# Patient Record
Sex: Male | Born: 2005 | Race: White | Hispanic: No | Marital: Single | Smoking: Never smoker
Health system: Southern US, Community
[De-identification: ages and names within clinical notes are randomized; demographics above are authoritative.]

## PROBLEM LIST (undated history)

## (undated) DIAGNOSIS — R51 Headache: Secondary | ICD-10-CM

## (undated) DIAGNOSIS — R519 Headache, unspecified: Secondary | ICD-10-CM

## (undated) HISTORY — PX: CIRCUMCISION: SUR203

## (undated) HISTORY — DX: Headache: R51

## (undated) HISTORY — DX: Headache, unspecified: R51.9

---
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
## 2023-04-22 DIAGNOSIS — J029 Acute pharyngitis, unspecified: Secondary | ICD-10-CM | POA: Diagnosis not present

## 2023-05-14 DIAGNOSIS — J309 Allergic rhinitis, unspecified: Secondary | ICD-10-CM | POA: Diagnosis not present

## 2023-05-14 DIAGNOSIS — J329 Chronic sinusitis, unspecified: Secondary | ICD-10-CM | POA: Diagnosis not present

## 2023-05-14 DIAGNOSIS — R0981 Nasal congestion: Secondary | ICD-10-CM | POA: Diagnosis not present

## 2023-07-23 ENCOUNTER — Emergency Department
Admission: EM | Admit: 2023-07-23 | Discharge: 2023-07-23 | Disposition: A | Discharge status: Home / Self Care | Payer: 59 | Attending: Emergency Medicine | Admitting: Emergency Medicine

## 2023-07-23 ENCOUNTER — Emergency Department: Payer: 59

## 2023-07-23 ENCOUNTER — Encounter: Payer: Self-pay | Admitting: Emergency Medicine

## 2023-07-23 DIAGNOSIS — N50812 Left testicular pain: Secondary | ICD-10-CM | POA: Insufficient documentation

## 2023-07-23 DIAGNOSIS — N50811 Right testicular pain: Secondary | ICD-10-CM | POA: Diagnosis not present

## 2023-07-23 DIAGNOSIS — N50819 Testicular pain, unspecified: Secondary | ICD-10-CM | POA: Diagnosis not present

## 2023-07-23 LAB — URINALYSIS, ROUTINE W REFLEX MICROSCOPIC
Bilirubin Urine: NEGATIVE
Glucose, UA: NEGATIVE mg/dL
Hgb urine dipstick: NEGATIVE
Ketones, ur: NEGATIVE mg/dL
Leukocytes,Ua: NEGATIVE
Nitrite: NEGATIVE
Protein, ur: NEGATIVE mg/dL
Specific Gravity, Urine: 1.004 — ABNORMAL LOW (ref 1.005–1.030)
pH: 6 (ref 5.0–8.0)

## 2023-07-23 LAB — CHLAMYDIA/NGC RT PCR (ARMC ONLY)
Chlamydia Tr: NOT DETECTED
N gonorrhoeae: NOT DETECTED

## 2023-07-23 NOTE — ED Notes (Signed)
ED Provider at bedside. 

## 2023-07-23 NOTE — ED Provider Notes (Signed)
[facility] Provider Note    Event Date/Time   First MD Initiated Contact with Patient 07/23/23 [phone]     (approximate)   History   Chief Complaint Testicle Pain   HPI  David Boyle is a 17 y.o. male with no significant past medical history who presents to the ED complaining of testicular pain.  Patient reports that he has been dealing with waxing and waning pain in both of his testicles for about the past week.  He states that both testicles seem to "shriveled up" into his groin with significant pain at times, but this is not brought on by anything in particular.  He denies any trauma to his testicles and has not noticed any swelling.  He denies any skin lesions and has not had any dysuria or penile discharge.  He states he has not been sexually active.  Pain got acutely worse this evening and so he came to the ED for further evaluation.     Physical Exam   Triage Vital Signs: ED Triage Vitals  Encounter Vitals Group     BP      Systolic BP Percentile      Diastolic BP Percentile      Pulse      Resp      Temp      Temp src      SpO2      Weight      Height      Head Circumference      Peak Flow      Pain Score      Pain Loc      Pain Education      Exclude from Growth Chart     Most recent vital signs: Vitals:   07/23/23 0527  BP: 139/85  Pulse: 98  Resp: 18  Temp: 98 F (36.7 C)  SpO2: 100%    Constitutional: Alert and oriented. Eyes: Conjunctivae are normal. Head: Atraumatic. Nose: No congestion/rhinnorhea. Mouth/Throat: Mucous membranes are moist.  Cardiovascular: Normal rate, regular rhythm. Grossly normal heart sounds.  2+ radial pulses bilaterally. Respiratory: Normal respiratory effort.  No retractions. Lungs CTAB. Gastrointestinal: Soft and nontender. No distention. Genitourinary: Bilateral testicular tenderness with no erythema, edema, or warmth noted.  No skin lesions noted. Musculoskeletal: No lower extremity  tenderness nor edema.  Neurologic:  Normal speech and language. No gross focal neurologic deficits are appreciated.    ED Results / Procedures / Treatments   Labs (all labs ordered are listed, but only abnormal results are displayed) Labs Reviewed  URINALYSIS, ROUTINE W REFLEX MICROSCOPIC - Abnormal; Notable for the following components:      Result Value   Color, Urine COLORLESS (*)    APPearance CLEAR (*)    Specific Gravity, Urine 1.004 (*)    All other components within normal limits  CHLAMYDIA/NGC RT PCR ([facility] ONLY)              RADIOLOGY Scrotal ultrasound reviewed and interpreted by me with no evidence of torsion.  PROCEDURES:  Critical Care performed: No  Procedures   MEDICATIONS ORDERED IN ED: Medications - No data to display   IMPRESSION / MDM / ASSESSMENT AND PLAN / ED COURSE  I reviewed the triage vital signs and the nursing notes.                              17 y.o. male with no significant  past medical history who presents to the ED complaining of waxing and waning pain to both testicles for the past week that got acutely worse this evening.  Patient's presentation is most consistent with acute presentation with potential threat to life or bodily function.  Differential diagnosis includes, but is not limited to, testicular torsion, epididymitis, varicocele, hydrocele, cellulitis, abscess.  Patient well-appearing and in no acute distress, vital signs are unremarkable.  He has tenderness to palpation of both testicles but no edema or skin lesions noted.  No findings concerning for infectious process, will further assess with ultrasound.  Urinalysis and STD testing pending at this time however patient states that he has not been sexually active.  He declines pain medication.  Scrotal ultrasound is unremarkable, urinalysis shows no signs of infection.  GC/committee a testing is pending but low suspicion for STI at this time.  On reassessment, patient with  significant improvement in pain and is appropriate for outpatient management with urology follow-up as needed.  He was counseled to return to the ED for new or worsening symptoms, patient and mother agree with plan.      FINAL CLINICAL IMPRESSION(S) / ED DIAGNOSES   Final diagnoses:  Pain in both testicles     Rx / DC Orders   ED Discharge Orders     None        Note:  This document was prepared using Dragon voice recognition software and may include unintentional dictation errors.   Chesley Noon, MD 07/23/23 [phone]

## 2023-07-23 NOTE — ED Triage Notes (Signed)
Pt presents ambulatory to triage via POV with complaints of bilateral testicle pain x1 week. Pt endorses some pain when trying to initiate voiding. Rates the discomfort 6/10. Endorses tenderness to palpation and has some mild swelling. A&Ox4 at this time. Denies CP or SOB.

## 2023-08-06 ENCOUNTER — Ambulatory Visit (INDEPENDENT_AMBULATORY_CARE_PROVIDER_SITE_OTHER): Payer: 59 | Admitting: Urology

## 2023-08-06 ENCOUNTER — Encounter: Payer: Self-pay | Admitting: Urology

## 2023-08-06 VITALS — BP 145/83 | HR 120 | Ht 74.0 in | Wt 172.0 lb

## 2023-08-06 DIAGNOSIS — M6289 Other specified disorders of muscle: Secondary | ICD-10-CM

## 2023-08-06 DIAGNOSIS — R1030 Lower abdominal pain, unspecified: Secondary | ICD-10-CM | POA: Diagnosis not present

## 2023-08-06 DIAGNOSIS — K5900 Constipation, unspecified: Secondary | ICD-10-CM | POA: Diagnosis not present

## 2023-08-06 DIAGNOSIS — M545 Low back pain, unspecified: Secondary | ICD-10-CM

## 2023-08-06 DIAGNOSIS — R350 Frequency of micturition: Secondary | ICD-10-CM

## 2023-08-06 DIAGNOSIS — N5082 Scrotal pain: Secondary | ICD-10-CM | POA: Diagnosis not present

## 2023-08-06 MED ORDER — CELECOXIB 200 MG PO CAPS: 200.0000 mg | ORAL_CAPSULE | Freq: Two times a day (BID) | ORAL | 0 refills | Status: DC

## 2023-08-06 NOTE — Progress Notes (Signed)
   08/06/23 3:21 PM   Hazen P Hemberger [birth year] [id number]  CC: Scrotal pain, urinary symptoms, abdominal pain, low back pain  HPI: 17 year old male here with his dad today for the above issues.  This all started after he got kicked by their dog in the scrotum.  He was seen in the ER on 07/23/2023 and a scrotal ultrasound was benign, urinalysis and STD testing was normal.  He has continued to have urinary frequency and nocturia, problems with intermittent constipation, low back pain, lower abdominal pain, as well as bilateral scrotal pain and testicular pain.  He denies any dysuria or gross hematuria.  He does have a history of anxiety, he feels this has gotten worse recently, not currently on any medications.   PMH: Past Medical History:  Diagnosis Date   Headache     Surgical History: Past Surgical History:  Procedure Laterality Date   CIRCUMCISION     Family History: Family History  Problem Relation Age of Onset   Migraines Mother    Anxiety disorder Mother    ADD / ADHD Father    Anxiety disorder Maternal Grandmother    Migraines Maternal Grandfather    Anxiety disorder Maternal Grandfather    Alzheimer's disease Paternal Grandmother    Bipolar disorder Neg Hx    Schizophrenia Neg Hx    Autism Neg Hx     Social History:  reports that he has never smoked. He has never been exposed to tobacco smoke. He has never used smokeless tobacco. No history on file for alcohol use and drug use.  Physical Exam: BP (!) 145/83   Pulse (!) 120   Ht 6\' 2"  (1.88 m)   Wt 172 lb (78 kg)   BMI 22.08 kg/m    Constitutional:  Alert and oriented, No acute distress. Cardiovascular: No clubbing, cyanosis, or edema. Respiratory: Normal respiratory effort, no increased work of breathing. GI: Abdomen is soft, nontender, nondistended, no abdominal masses GU: Circumcised phallus with patent meatus, genital region tender throughout and very sensitive, testicles 20 cc and descended  bilaterally without masses, no skin lesions  Imaging I personally viewed and interpreted the scrotal ultrasound dated 9/25 with no abnormalities  Assessment & Plan:   17 year old male with constellation of symptoms including bilateral scrotal pain, urinary frequency, lower abdominal and lower back pain, as well as constipation that seems most consistent with pelvic floor dysfunction.  Urinalysis and STD testing was negative, and scrotal ultrasound was benign.  I feel anxiety likely contributing and an exacerbating factor.  Could consider CT in the future, but low suspicion for kidney stones or other pathology with benign urinalysis, symptoms most consistent with pelvic floor dysfunction especially in the setting of acute onset of symptoms after being kicked by dog.  We discussed the concept of pelvic floor dysfunction and treatment strategies.  I recommended avoiding bladder irritants, decreasing stress, and pelvic floor stretches.  Trial of Celebrex 200 mg twice daily x 10 days, stretching exercises provided, referral placed to pelvic floor physical therapy Recommend considering anxiety medication with PCP RTC 3 weeks symptom check, could trial Flexeril if persistent symptoms  Legrand Rams, MD 08/06/2023  [facility] [address] [place], [place] [address] [phone]

## 2023-08-06 NOTE — Patient Instructions (Signed)
Pelvic Floor Dysfunction, Male     Pelvic floor dysfunction (PFD) is a condition that results when the group of muscles and connective tissues that support the organs in the pelvis (pelvic floor muscles) do not work well. These muscles and their connections form a sling that supports the colon and bladder. In men, these muscles also support the prostate gland. PFD causes pelvic floor muscles to be too weak, too tight, or both. In PFD, muscle movements are not coordinated. This may cause bowel or bladder problems. It may also cause pain. What are the causes? This condition may be caused by an injury to the pelvic area or by a weakening of pelvic muscles. In many cases, the exact cause is not known. What increases the risk? The following factors may make you more likely to develop PFD: Having chronic bladder tissue inflammation (interstitial cystitis). Being an older person. Being overweight. History of radiation treatment for cancer in the pelvic region. Previous pelvic surgery, such as removal of the prostate gland (prostatectomy). What are the signs or symptoms? Symptoms of this condition vary and may include: Bladder symptoms, such as: Trouble starting urination and emptying the bladder. Frequent urinary tract infections. Leaking urine when coughing, laughing, or exercising (stress incontinence). Having to pass urine urgently or frequently. Pain when passing urine. Bowel symptoms, such as: Constipation. Urgent or frequent bowel movements. Incomplete bowel movements. Painful bowel movements. Leaking stool or gas. Unexplained genital or rectal pain. Genital or rectal muscle spasms. Low back pain. Sexual dysfunction, such as erectile dysfunction, premature ejaculation, or pain during or after sexual activity. How is this diagnosed? This condition is diagnosed based on: Your symptoms and medical history. A physical exam. During the exam, your health care provider may check your  pelvic muscles for tightness, spasm, pain, or weakness. This may include a rectal exam. In some cases, you may have diagnostic tests, such as: Electrical muscle function tests. Urine flow testing. X-ray tests of bowel function. Ultrasound of the pelvic organs. How is this treated? Treatment for this condition depends on your symptoms. Treatment options include: Physical therapy. This may include Kegel exercises to help relax or strengthen the pelvic floor muscles. Biofeedback. This type of therapy provides feedback on how tight your pelvic floor muscles are so that you can learn to control them. Massage therapy. A treatment that involves electrical stimulation of the pelvic floor muscles to help control pain (transcutaneous electrical nerve stimulation, or TENS). Sound wave therapy (ultrasound) to reduce muscle spasms. Medicines, such as: Muscle relaxants. Bladder control medicines. Follow these instructions at home: Activity Do your usual activities as told by your health care provider. Ask your health care provider if you should modify any activities. Do pelvic floor strengthening or relaxing exercises at home as told by your physical therapist. Lifestyle Maintain a healthy weight. Eat foods that are high in fiber, such as beans, whole grains, and fresh fruits and vegetables. Limit foods that are high in fat and processed sugars, such as fried or sweet foods. Manage stress with relaxation techniques such as yoga or meditation. General instructions If you have problems with leakage: Use absorbable pads or wear padded underwear. Wash your genital and anal area frequently with mild soap. Keep your genital and anal area as clean and dry as possible. Ask your health care provider if you should try a barrier cream to prevent skin irritation. Take warm baths to relieve pelvic muscle tension or spasms. Take over-the-counter and prescription medicines only as told by your health  care  provider. Keep all follow-up visits. How is this prevented? The cause of PFD is not always known, but there are a few things you can do to reduce the risk of developing this condition, including: Staying at a healthy weight. Getting regular exercise. Managing stress. Contact a health care provider if: Your symptoms are not improving with home care. You have signs or symptoms of PFD that get worse. You develop new signs or symptoms. You have signs of a urinary tract infection, such as: Fever. Chills. Increased urinary frequency. A burning feeling when urinating. You have not had a bowel movement in 3 days (constipation). Summary Pelvic floor dysfunction results when the muscles and connective tissues in your pelvic floor do not work well. These muscles and their connections form a sling that supports your colon and bladder. In men, these muscles also support the prostate gland. PFD may be caused by an injury to the pelvic area or by a weakening of pelvic muscles. PFD causes pelvic floor muscles to be too weak, too tight, or a combination of both. Symptoms may vary from person to person. In most cases, PFD can be treated with physical therapies and medicines. Surgery may be an option if other treatments do not help. This information is not intended to replace advice given to you by your health care provider. Make sure you discuss any questions you have with your health care provider. Document Revised: 02/21/2021 Document Reviewed: 02/21/2021 Elsevier Patient Education  2024 [employer].

## 2023-08-13 ENCOUNTER — Other Ambulatory Visit: Payer: Self-pay | Admitting: Urology

## 2023-08-20 ENCOUNTER — Telehealth: Payer: Self-pay | Admitting: Urology

## 2023-08-20 NOTE — Telephone Encounter (Signed)
Called pt's mother, informed her of the information per Dr. Richardo Hanks. Mother voiced understanding.

## 2023-08-20 NOTE — Telephone Encounter (Signed)
Please advise 

## 2023-08-20 NOTE — Telephone Encounter (Signed)
Pt has upcoming appt w/[facility] on 10/30.  Pt's mother called asking if pt could get a refill for Celebrex sent to [facility] in [place].  She said it wouldn't let them request a refill online.

## 2023-08-27 ENCOUNTER — Ambulatory Visit: Payer: 59 | Admitting: Urology

## 2023-08-27 ENCOUNTER — Encounter: Payer: Self-pay | Admitting: Urology

## 2023-08-27 VITALS — BP 131/86 | HR 101 | Ht 74.0 in | Wt 170.2 lb

## 2023-08-27 DIAGNOSIS — M6289 Other specified disorders of muscle: Secondary | ICD-10-CM

## 2023-08-27 DIAGNOSIS — Z87898 Personal history of other specified conditions: Secondary | ICD-10-CM | POA: Diagnosis not present

## 2023-08-27 DIAGNOSIS — Z09 Encounter for follow-up examination after completed treatment for conditions other than malignant neoplasm: Secondary | ICD-10-CM | POA: Diagnosis not present

## 2023-08-27 DIAGNOSIS — Z87438 Personal history of other diseases of male genital organs: Secondary | ICD-10-CM | POA: Diagnosis not present

## 2023-08-27 NOTE — Progress Notes (Signed)
   08/27/2023 1:24 PM   David Boyle 01-14-2006 [id number]  Reason for visit: Follow up pelvic floor dysfunction  HPI: 17 year old male who I originally saw on 08/06/2023 for severe symptoms of urinary frequency, nocturia, low back pain, constipation, lower abdominal pain, bilateral scrotal and testicular pain.  Evaluation in the ER with urinalysis, STD testing and scrotal ultrasound were normal.  His symptoms started after being kicked by dog.  He was treated with a 2-week course of Celebrex, and pelvic floor stretching exercises.  He reports his symptoms have completely resolved.  He denies any complaints today.  Reassurance provided that his clinical picture is most consistent with pelvic floor dysfunction, if recurrence of symptoms recommend a course of NSAIDs, and consider referral to pelvic floor physical therapy if worsening symptoms.  Follow-up with urology as needed  Sondra Come, MD  [facility] [address] [place], [place] [address] [phone]

## 2023-08-27 NOTE — Patient Instructions (Signed)
Pelvic Floor Dysfunction, Male     Pelvic floor dysfunction (PFD) is a condition that results when the group of muscles and connective tissues that support the organs in the pelvis (pelvic floor muscles) do not work well. These muscles and their connections form a sling that supports the colon and bladder. In men, these muscles also support the prostate gland. PFD causes pelvic floor muscles to be too weak, too tight, or both. In PFD, muscle movements are not coordinated. This may cause bowel or bladder problems. It may also cause pain. What are the causes? This condition may be caused by an injury to the pelvic area or by a weakening of pelvic muscles. In many cases, the exact cause is not known. What increases the risk? The following factors may make you more likely to develop PFD: Having chronic bladder tissue inflammation (interstitial cystitis). Being an older person. Being overweight. History of radiation treatment for cancer in the pelvic region. Previous pelvic surgery, such as removal of the prostate gland (prostatectomy). What are the signs or symptoms? Symptoms of this condition vary and may include: Bladder symptoms, such as: Trouble starting urination and emptying the bladder. Frequent urinary tract infections. Leaking urine when coughing, laughing, or exercising (stress incontinence). Having to pass urine urgently or frequently. Pain when passing urine. Bowel symptoms, such as: Constipation. Urgent or frequent bowel movements. Incomplete bowel movements. Painful bowel movements. Leaking stool or gas. Unexplained genital or rectal pain. Genital or rectal muscle spasms. Low back pain. Sexual dysfunction, such as erectile dysfunction, premature ejaculation, or pain during or after sexual activity. How is this diagnosed? This condition is diagnosed based on: Your symptoms and medical history. A physical exam. During the exam, your health care provider may check your  pelvic muscles for tightness, spasm, pain, or weakness. This may include a rectal exam. In some cases, you may have diagnostic tests, such as: Electrical muscle function tests. Urine flow testing. X-ray tests of bowel function. Ultrasound of the pelvic organs. How is this treated? Treatment for this condition depends on your symptoms. Treatment options include: Physical therapy. This may include Kegel exercises to help relax or strengthen the pelvic floor muscles. Biofeedback. This type of therapy provides feedback on how tight your pelvic floor muscles are so that you can learn to control them. Massage therapy. A treatment that involves electrical stimulation of the pelvic floor muscles to help control pain (transcutaneous electrical nerve stimulation, or TENS). Sound wave therapy (ultrasound) to reduce muscle spasms. Medicines, such as: Muscle relaxants. Bladder control medicines. Surgery to reconstruct or support pelvic floor muscles may be an option if other treatments do not help. Follow these instructions at home: Activity Do your usual activities as told by your health care provider. Ask your health care provider if you should modify any activities. Do pelvic floor strengthening or relaxing exercises at home as told by your physical therapist. Lifestyle Maintain a healthy weight. Eat foods that are high in fiber, such as beans, whole grains, and fresh fruits and vegetables. Limit foods that are high in fat and processed sugars, such as fried or sweet foods. Manage stress with relaxation techniques such as yoga or meditation. General instructions If you have problems with leakage: Use absorbable pads or wear padded underwear. Wash your genital and anal area frequently with mild soap. Keep your genital and anal area as clean and dry as possible. Ask your health care provider if you should try a barrier cream to prevent skin irritation. Take warm  baths to relieve pelvic muscle  tension or spasms. Take over-the-counter and prescription medicines only as told by your health care provider. Keep all follow-up visits. How is this prevented? The cause of PFD is not always known, but there are a few things you can do to reduce the risk of developing this condition, including: Staying at a healthy weight. Getting regular exercise. Managing stress. Contact a health care provider if: Your symptoms are not improving with home care. You have signs or symptoms of PFD that get worse. You develop new signs or symptoms. You have signs of a urinary tract infection, such as: Fever. Chills. Increased urinary frequency. A burning feeling when urinating. You have not had a bowel movement in 3 days (constipation). Summary Pelvic floor dysfunction results when the muscles and connective tissues in your pelvic floor do not work well. These muscles and their connections form a sling that supports your colon and bladder. In men, these muscles also support the prostate gland. PFD may be caused by an injury to the pelvic area or by a weakening of pelvic muscles. PFD causes pelvic floor muscles to be too weak, too tight, or a combination of both. Symptoms may vary from person to person. In most cases, PFD can be treated with physical therapies and medicines. Surgery may be an option if other treatments do not help. This information is not intended to replace advice given to you by your health care provider. Make sure you discuss any questions you have with your health care provider. Document Revised: 02/21/2021 Document Reviewed: 02/21/2021 Elsevier Patient Education  2024 [employer].

## 2023-09-18 NOTE — Telephone Encounter (Signed)
Pt's mom called office to let us know pt is no better than when he came the first time.  He has not heard from pelvic floor PT.  She is going to try [facility].  She asked for a follow up appt w/[facility] in [facility] 12/11.  Do you know if PT is way behind in callng pt's to schedule?

## 2023-10-07 DIAGNOSIS — R102 Pelvic and perineal pain: Secondary | ICD-10-CM | POA: Diagnosis not present

## 2023-10-07 DIAGNOSIS — M545 Low back pain: Secondary | ICD-10-CM | POA: Diagnosis not present

## 2023-10-07 DIAGNOSIS — M62838 Other muscle spasm: Secondary | ICD-10-CM | POA: Diagnosis not present

## 2023-10-07 DIAGNOSIS — R350 Frequency of micturition: Secondary | ICD-10-CM | POA: Diagnosis not present

## 2023-10-08 ENCOUNTER — Ambulatory Visit: Payer: 59 | Admitting: Urology

## 2023-11-05 ENCOUNTER — Ambulatory Visit: Payer: 59 | Admitting: Urology

## 2023-11-14 DIAGNOSIS — G43909 Migraine, unspecified, not intractable, without status migrainosus: Secondary | ICD-10-CM | POA: Diagnosis not present

## 2023-11-14 DIAGNOSIS — Z114 Encounter for screening for human immunodeficiency virus [HIV]: Secondary | ICD-10-CM | POA: Diagnosis not present

## 2023-11-14 DIAGNOSIS — R102 Pelvic and perineal pain: Secondary | ICD-10-CM | POA: Diagnosis not present

## 2023-11-14 DIAGNOSIS — R1084 Generalized abdominal pain: Secondary | ICD-10-CM | POA: Diagnosis not present

## 2023-11-14 DIAGNOSIS — Z0000 Encounter for general adult medical examination without abnormal findings: Secondary | ICD-10-CM | POA: Diagnosis not present

## 2023-11-14 DIAGNOSIS — G43019 Migraine without aura, intractable, without status migrainosus: Secondary | ICD-10-CM | POA: Diagnosis not present

## 2023-11-14 DIAGNOSIS — F411 Generalized anxiety disorder: Secondary | ICD-10-CM | POA: Diagnosis not present

## 2023-11-18 ENCOUNTER — Other Ambulatory Visit: Payer: Self-pay | Admitting: Infectious Diseases

## 2023-11-18 DIAGNOSIS — F411 Generalized anxiety disorder: Secondary | ICD-10-CM

## 2023-11-18 DIAGNOSIS — R1084 Generalized abdominal pain: Secondary | ICD-10-CM

## 2023-11-18 DIAGNOSIS — G43909 Migraine, unspecified, not intractable, without status migrainosus: Secondary | ICD-10-CM

## 2023-11-18 DIAGNOSIS — Z0000 Encounter for general adult medical examination without abnormal findings: Secondary | ICD-10-CM

## 2023-11-24 ENCOUNTER — Ambulatory Visit
Admission: RE | Admit: 2023-11-24 | Discharge: 2023-11-24 | Disposition: A | Discharge status: Home / Self Care | Payer: 59 | Source: Ambulatory Visit | Attending: Infectious Diseases | Admitting: Infectious Diseases

## 2023-11-24 DIAGNOSIS — F411 Generalized anxiety disorder: Secondary | ICD-10-CM | POA: Diagnosis not present

## 2023-11-24 DIAGNOSIS — Z0000 Encounter for general adult medical examination without abnormal findings: Secondary | ICD-10-CM | POA: Diagnosis not present

## 2023-11-24 DIAGNOSIS — R1084 Generalized abdominal pain: Secondary | ICD-10-CM | POA: Insufficient documentation

## 2023-11-24 DIAGNOSIS — G43909 Migraine, unspecified, not intractable, without status migrainosus: Secondary | ICD-10-CM | POA: Insufficient documentation

## 2023-11-24 DIAGNOSIS — R102 Pelvic and perineal pain: Secondary | ICD-10-CM | POA: Diagnosis not present

## 2023-11-24 MED ORDER — IOHEXOL 300 MG/ML  SOLN
100.0000 mL | Freq: Once | INTRAMUSCULAR | Status: AC | PRN
  Administered 2023-11-24: 100 mL via INTRAVENOUS

## 2023-12-03 ENCOUNTER — Emergency Department
Admission: EM | Admit: 2023-12-03 | Discharge: 2023-12-03 | Disposition: A | Discharge status: Home / Self Care | Payer: 59 | Attending: Emergency Medicine | Admitting: Emergency Medicine

## 2023-12-03 ENCOUNTER — Emergency Department: Payer: 59

## 2023-12-03 ENCOUNTER — Other Ambulatory Visit: Payer: Self-pay

## 2023-12-03 DIAGNOSIS — N5082 Scrotal pain: Secondary | ICD-10-CM | POA: Diagnosis not present

## 2023-12-03 DIAGNOSIS — N50819 Testicular pain, unspecified: Secondary | ICD-10-CM | POA: Diagnosis not present

## 2023-12-03 DIAGNOSIS — N50811 Right testicular pain: Secondary | ICD-10-CM | POA: Diagnosis not present

## 2023-12-03 LAB — URINALYSIS, ROUTINE W REFLEX MICROSCOPIC
Bilirubin Urine: NEGATIVE
Glucose, UA: NEGATIVE mg/dL
Hgb urine dipstick: NEGATIVE
Ketones, ur: NEGATIVE mg/dL
Leukocytes,Ua: NEGATIVE
Nitrite: NEGATIVE
Protein, ur: NEGATIVE mg/dL
Specific Gravity, Urine: 1.008 (ref 1.005–1.030)
pH: 6 (ref 5.0–8.0)

## 2023-12-03 LAB — CHLAMYDIA/NGC RT PCR (ARMC ONLY)
Chlamydia Tr: NOT DETECTED
N gonorrhoeae: NOT DETECTED

## 2023-12-03 MED ORDER — SULFAMETHOXAZOLE-TRIMETHOPRIM 800-160 MG PO TABS: 1.0000 | ORAL_TABLET | Freq: Two times a day (BID) | ORAL | 0 refills | Status: AC

## 2023-12-03 NOTE — ED Provider Triage Note (Signed)
 Emergency Medicine Provider Triage Evaluation Note  Kong ANTWANN PREZIOSI , a 18 y.o. male  was evaluated in triage.  Pt complains of testicular pain, feeling of cold, uncomfortable  Review of Systems  Positive:  Negative:   Physical Exam  BP 135/81   Pulse 83   Temp 97.8 F (36.6 C) (Oral)   Resp 18   Ht 6' 2 (1.88 m)   Wt 74.8 kg   SpO2 99%   BMI 21.18 kg/m  Gen:   Awake, no distress   Resp:  Normal effort  MSK:   Moves extremities without difficulty  Other:    Medical Decision Making  Medically screening exam initiated at 7:51 PM.  Appropriate orders placed.  Armanie P Sokoloski was informed that the remainder of the evaluation will be completed by another provider, this initial triage assessment does not replace that evaluation, and the importance of remaining in the ED until their evaluation is complete.  Chamber possible testicular torsion ordered US    Janit Kast, PA-C 12/03/23 1952

## 2023-12-03 NOTE — ED Provider Notes (Signed)
 [facility] Provider Note    Event Date/Time   First MD Initiated Contact with Patient 12/03/23 2128     (approximate)   History   Testicle Pain   HPI David Boyle is a 18 y.o. male presenting today for testicular pain.  Patient states he has had on and off testicular pain since September 2024.  He has been followed by his PCP and urologist for this with prior negative ultrasounds and CT imaging.  Negative blood work and urine as well.  Today he had onset of symptoms again with a tingling sensation.  Denies any burning pain with urination.  No numbness or tingling down his extremities.  No loss of bowel or bladder.  No other recent trauma.  Denies fevers or chills.  Reviewed outpatient notes regarding prior testicular workup.     Physical Exam   Triage Vital Signs: ED Triage Vitals  Encounter Vitals Group     BP 12/03/23 1948 135/81     Systolic BP Percentile --      Diastolic BP Percentile --      Pulse Rate 12/03/23 1948 83     Resp 12/03/23 1948 18     Temp 12/03/23 1948 97.8 F (36.6 C)     Temp Source 12/03/23 1948 Oral     SpO2 12/03/23 1948 99 %     Weight 12/03/23 1949 165 lb (74.8 kg)     Height 12/03/23 1949 6' 2 (1.88 m)     Head Circumference --      Peak Flow --      Pain Score 12/03/23 1949 7     Pain Loc --      Pain Education --      Exclude from Growth Chart --     Most recent vital signs: Vitals:   12/03/23 1948 12/03/23 2132  BP: 135/81 112/74  Pulse: 83 82  Resp: 18 18  Temp: 97.8 F (36.6 C) 98.4 F (36.9 C)  SpO2: 99% 100%   I have reviewed the vital signs. General:  Awake, alert, no acute distress. Head:  Normocephalic, Atraumatic. EENT:  PERRL, EOMI, Oral mucosa pink and moist, Neck is supple. Cardiovascular: Regular rate, 2+ distal pulses. Respiratory:  Normal respiratory effort, symmetrical expansion, no distress.   Abdomen: No tenderness palpation throughout the abdomen GU: Normal-appearing  scrotum without swelling.  No erythema.  Nontender to palpation. Extremities:  Moving all four extremities through full ROM without pain.   Neuro:  Alert and oriented.  Interacting appropriately.   Skin:  Warm, dry, no rash.   Psych: Appropriate affect.    ED Results / Procedures / Treatments   Labs (all labs ordered are listed, but only abnormal results are displayed) Labs Reviewed  URINALYSIS, ROUTINE W REFLEX MICROSCOPIC - Abnormal; Notable for the following components:      Result Value   Color, Urine STRAW (*)    APPearance CLEAR (*)    All other components within normal limits  CHLAMYDIA/NGC RT PCR ([facility] ONLY)               EKG    RADIOLOGY Independently interpreted scrotal ultrasound with no acute pathology   PROCEDURES:  Critical Care performed: No  Procedures   MEDICATIONS ORDERED IN ED: Medications - No data to display   IMPRESSION / MDM / ASSESSMENT AND PLAN / ED COURSE  I reviewed the triage vital signs and the nursing notes.  Differential diagnosis includes, but is not limited to, testicular torsion, epididymitis, UTI, STI  Patient's presentation is most consistent with acute complicated illness / injury requiring diagnostic workup.  Patient is a 18 year old male with acute on chronic testicular pain.  Has had extensive workup outpatient largely concerning for pelvic floor dysfunction and currently receiving treatment for that.  No obvious concerning features on exam today and vital signs stable.  UA without signs of infection.  Negative gonorrhea and chlamydia.  Scrotal ultrasound is also unremarkable at this time.  Patient has had ongoing symptoms and followed with urology and getting pelvic floor PT.  He has never been trialed on antibiotics to treat any possible infection.  I do think is probably worthwhile for a short course of this and family is agreeable with this and would like to try.  Will have them follow-up with  their PCP and given strict return precautions.     FINAL CLINICAL IMPRESSION(S) / ED DIAGNOSES   Final diagnoses:  Pain in testicle, unspecified laterality     Rx / DC Orders   ED Discharge Orders          Ordered    sulfamethoxazole -trimethoprim  (BACTRIM  DS) 800-160 MG tablet  2 times daily        12/03/23 2250             Note:  This document was prepared using Dragon voice recognition software and may include unintentional dictation errors.   Malvina Alm DASEN, MD 12/03/23 2256

## 2023-12-03 NOTE — ED Triage Notes (Addendum)
Pt reports testicle pain that began earlier today while trying to urinate. Pt states he has not been able to urinate since and his testicles feel cold. Pt reports some dysuria that began prior to the pain.

## 2023-12-03 NOTE — ED Notes (Signed)
 PT contact made and myself introduced. Pt is CAOx4, breathing normally, and normal in color. Pt is in bed resting, does not appear to be in any obvious distress, and has family at bedside.

## 2023-12-03 NOTE — Discharge Instructions (Signed)
 You can pick up your antibiotics from the pharmacy and take as prescribed.  The most common side effect is nausea or diarrhea.  Please follow-up with urology team and PCP as scheduled.

## 2023-12-05 DIAGNOSIS — R1084 Generalized abdominal pain: Secondary | ICD-10-CM | POA: Diagnosis not present

## 2023-12-05 DIAGNOSIS — G43909 Migraine, unspecified, not intractable, without status migrainosus: Secondary | ICD-10-CM | POA: Diagnosis not present

## 2023-12-05 DIAGNOSIS — R102 Pelvic and perineal pain: Secondary | ICD-10-CM | POA: Diagnosis not present

## 2023-12-05 DIAGNOSIS — F411 Generalized anxiety disorder: Secondary | ICD-10-CM | POA: Diagnosis not present

## 2023-12-24 ENCOUNTER — Ambulatory Visit (INDEPENDENT_AMBULATORY_CARE_PROVIDER_SITE_OTHER): Payer: 59 | Admitting: Urology

## 2023-12-24 ENCOUNTER — Encounter: Payer: Self-pay | Admitting: Urology

## 2023-12-24 VITALS — BP 132/88 | HR 78 | Ht 74.0 in | Wt 158.0 lb

## 2023-12-24 DIAGNOSIS — R102 Pelvic and perineal pain: Secondary | ICD-10-CM

## 2023-12-24 MED ORDER — CELECOXIB 200 MG PO CAPS: 200.0000 mg | ORAL_CAPSULE | Freq: Two times a day (BID) | ORAL | 0 refills | Status: AC

## 2023-12-24 NOTE — Patient Instructions (Addendum)
 Amitriptyline is a medication sometimes used for patients with interstitial cystitis/painful bladder syndrome/pelvic floor dysfunction.  This would need to be cleared by your PCP who prescribes Lexapro, as typically we do not mix these medications.  Interstitial Cystitis  Interstitial cystitis (IC) is inflammation of the bladder. It's also called painful bladder syndrome. It can cause pain near your bladder. It can also make you have to pee urgently and often. IC may flare up and then go away for a while. In some cases, it may become a long-term (chronic) problem. What are the causes? The cause of IC isn't known. What increases the risk? You may be more likely to get IC if: You're male. You have certain other conditions. These include: Fibromyalgia. Irritable bowel syndrome (IBS). Endometriosis. Chronic fatigue syndrome. You may have worse symptoms if: You're under a lot of stress. You smoke. You have certain foods or drinks. What are the signs or symptoms? Your symptoms may change over time. They may include: Discomfort or pain near your bladder. The pain may range from mild to very bad. You may have more or less pain as your bladder fills with pee and empties. Pain in your pelvic area. This is the area between your hip bones. Needing to pee often or all the time. Pain when you pee. Pain during sex. Blood in your pee. Tiredness. If you're male, your symptoms may get worse when you have your menstrual period. How is this diagnosed? IC is diagnosed based on your symptoms, your medical history, and an exam. Your health care provider may need to rule out other conditions. They may do tests, such as: Pee tests. A cystoscopy. This test looks at the inside of your bladder. Biopsy. This is when a small piece of tissue is removed from your bladder for testing. How is this treated? There's no cure for IC. But treatment can help you manage your symptoms. Work with your provider to  find the best treatments for you. These may include: Medicines to help with pain or to reduce how often you feel the need to pee. These may be given by mouth or put in your bladder using a soft tube called a catheter. Diet changes. Taking steps to manage stress. Physical therapy. This may include: Exercises. These can help you relax your pelvic floor muscles. Massage. This may be done to relax tight muscles. Bladder training. This is when you learn ways to control when you pee. Neuromodulation therapy. This uses a device that's put on your back. It blocks the nerves that cause you to feel pain near your bladder. A procedure to stretch your bladder. This may be done by filling your bladder with air or fluid.  Follow these instructions at home: Eating and drinking Make changes to your diet as told by your provider. You may need to avoid: Spicy foods. Foods with acid in them, like tomatoes or citrus. Foods with a lot of potassium in them. Avoid drinking alcohol and caffeine. These drinks can make you have to pee more. Lifestyle Learn and practice ways to relax. These may include deep breathing and muscle relaxation. Get care for your mental well-being. You may need to: Do cognitive behavioral therapy (CBT). This therapy can change the way you think or act in response to things. It may help you feel better. See a therapist if you're depressed. Work with your provider on other ways to manage pain. Acupuncture may help. Do not use any products that contain nicotine or tobacco. These products include  cigarettes, chewing tobacco, and vaping devices, such as e-cigarettes. If you need help quitting, ask your provider. Bladder training  Do bladder training as told. You may need to: Pee at set, regular times. Train yourself to delay peeing. Keep a bladder diary. Write down: The times you pee. Any symptoms you have. The diary can help you find out what makes your symptoms worse. Use the diary  to schedule times to pee. If you're away from home, plan to be near a bathroom at those times. Make sure you pee: Just before you leave the house. Just before you go to bed. General instructions Take over-the-counter and prescription medicines only as told by your provider. Try putting a warm or cool cloth called a compress over your bladder. This can help with pain. Avoid wearing tight clothes. Do exercises as told by your provider. Where to find more information [employer]: urologyhealth.org Interstitial Cystitis Association (ICA): [employer] Contact a health care provider if: Your symptoms don't get better with treatment. Your pain or discomfort gets worse. You have to pee more often. You have little to no control over when you pee. You have a fever or chills. This information is not intended to replace advice given to you by your health care provider. Make sure you discuss any questions you have with your health care provider. Document Revised: 01/18/2023 Document Reviewed: 01/18/2023 Elsevier Patient Education  2024 Elsevier Inc.  Eating Plan for Interstitial Cystitis Interstitial cystitis (IC) is a long-term (chronic) condition that can cause pain and pressure near your bladder. It can also make you have to pee urgently and often. Symptoms may come and go. Certain foods may trigger your symptoms. Learning what foods bother you can help you come up with an eating plan to manage IC. What are tips for following this plan? You may want to work with an expert in healthy eating called a dietitian. They can help you make an eating plan by doing an elimination diet. This diet involves: Making a list of foods that you think trigger your symptoms. You may also want to include foods that often cause symptoms in other people with IC. It may take a few months to find out which foods bother you. Taking those foods out of your diet for about a month. After that month, you can try to  have the foods again one at a time to see which ones cause symptoms. Reading food labels Once you know which foods trigger your symptoms, you can avoid them. But it's still a good idea to read food labels. Some foods that cause your symptoms may be ingredients in other foods. These foods may include: Soy. Worcestershire sauce. Vinegar. Alcohol. Artificial sweeteners. Monosodium glutamate. Other triggers may include: Chili peppers. Tomato products. Citrus fruits, flavors, or juices. Shopping Shopping can be hard if many foods trigger your IC. Bring a list of the foods you can't eat with you when you go to the store. You can get an app for your phone that lets you know which foods are safer and which ones you may want to avoid. You can find the app at the [employer] website: ic-network.com Meal planning Plan your meals based on the results of your elimination diet. If you haven't done the diet yet, plan meals based on IC food lists. These lists may be given to you by your health care provider or dietitian. The lists tell you which foods are least and most likely to cause symptoms. Avoid certain types of  food when you go out to eat. These may include: Pizza. [place] food. [place] food. [place] food. General information Do not eat large portions. Drink lots of fluids with your meals. Do not eat foods that are high in sugar, salt, or saturated fat. Choose whole fruits instead of juice. Eat a colorful variety of vegetables. Find ways to manage stress. Get enough exercise. What foods should I eat? For people with IC, the best diet is a balanced one. This means it includes things from all the food groups. Even if you have to avoid certain foods, there are still lots of healthy choices in each group. Below are some foods that may be safest for you to eat: Fruits Bananas. Blueberries and blueberry juice. Melons. Pears. Apples. Dates. Prunes. Raisins. Apricots. Vegetables Asparagus. Avocado.  Celery. Beets. Bell peppers. Black olives. Broccoli. Brussels sprouts. Cabbage. Carrots. Cauliflower. Cucumber. Eggplant. Green beans. Potatoes. Radishes. Spinach. Squash. Turnips. Zucchini. Mushrooms. Peas. Grains Oats. Rice. Bran. Oatmeal. Whole wheat bread. Meats and other proteins Beef. Fish and other seafood. Eggs. Nuts. Peanut butter. Pork. Poultry. Lamb. Garbanzo beans. Pinto beans. Dairy Whole or low-fat milk. American, mozzarella, mild cheddar, feta, ricotta, and cream cheeses. The items listed above may not be all the foods and drinks you can have. Talk to a dietitian to learn more. What foods should I avoid? You should avoid any foods that cause symptoms. It's also a good idea to avoid foods that often cause symptoms in many people with IC. These foods include: Fruits Citrus fruits, such as lemons, limes, oranges, and grapefruit. Cranberries. Strawberries. Pineapple. Kiwi. Vegetables Chili peppers. Onions. Sauerkraut. Tomato and tomato products. Rosita Fire. Grains You don't need to avoid any type of grain unless it causes symptoms. Meats and other proteins Precooked or cured meats, such as sausages or meat loaves. Soy products. Dairy Chocolate ice cream. Processed cheese. Yogurt. Drinks Alcohol. Chocolate drinks. Coffee. Cranberry juice. Fizzy drinks. Black, green, or herbal tea. Tomato juice. Sports drinks. The items listed above may not be all the foods and drinks you should avoid. Talk to a dietitian to learn more. This information is not intended to replace advice given to you by your health care provider. Make sure you discuss any questions you have with your health care provider. Document Revised: 01/23/2023 Document Reviewed: 01/23/2023 Elsevier Patient Education  2024 Elsevier Inc.  Pelvic Floor Dysfunction, Male     Pelvic floor dysfunction (PFD) is a condition that results when the group of muscles and connective tissues that support the organs in the pelvis (pelvic  floor muscles) do not work well. These muscles and their connections form a sling that supports the colon and bladder. In men, these muscles also support the prostate gland. PFD causes pelvic floor muscles to be too weak, too tight, or both. In PFD, muscle movements are not coordinated. This may cause bowel or bladder problems. It may also cause pain. What are the causes? This condition may be caused by an injury to the pelvic area or by a weakening of pelvic muscles. In many cases, the exact cause is not known. What increases the risk? The following factors may make you more likely to develop PFD: Having chronic bladder tissue inflammation (interstitial cystitis). Being an older person. Being overweight. History of radiation treatment for cancer in the pelvic region. Previous pelvic surgery, such as removal of the prostate gland (prostatectomy). What are the signs or symptoms? Symptoms of this condition vary and may include: Bladder symptoms, such as: Trouble starting urination and  emptying the bladder. Frequent urinary tract infections. Leaking urine when coughing, laughing, or exercising (stress incontinence). Having to pass urine urgently or frequently. Pain when passing urine. Bowel symptoms, such as: Constipation. Urgent or frequent bowel movements. Incomplete bowel movements. Painful bowel movements. Leaking stool or gas. Unexplained genital or rectal pain. Genital or rectal muscle spasms. Low back pain. Sexual dysfunction, such as erectile dysfunction, premature ejaculation, or pain during or after sexual activity. How is this diagnosed? This condition is diagnosed based on: Your symptoms and medical history. A physical exam. During the exam, your health care provider may check your pelvic muscles for tightness, spasm, pain, or weakness. This may include a rectal exam. In some cases, you may have diagnostic tests, such as: Electrical muscle function tests. Urine flow  testing. X-ray tests of bowel function. Ultrasound of the pelvic organs. How is this treated? Treatment for this condition depends on your symptoms. Treatment options include: Physical therapy. This may include Kegel exercises to help relax or strengthen the pelvic floor muscles. Biofeedback. This type of therapy provides feedback on how tight your pelvic floor muscles are so that you can learn to control them. Massage therapy. A treatment that involves electrical stimulation of the pelvic floor muscles to help control pain (transcutaneous electrical nerve stimulation, or TENS). Sound wave therapy (ultrasound) to reduce muscle spasms. Medicines, such as: Muscle relaxants. Bladder control medicines. Surgery to reconstruct or support pelvic floor muscles may be an option if other treatments do not help. Follow these instructions at home: Activity Do your usual activities as told by your health care provider. Ask your health care provider if you should modify any activities. Do pelvic floor strengthening or relaxing exercises at home as told by your physical therapist. Lifestyle Maintain a healthy weight. Eat foods that are high in fiber, such as beans, whole grains, and fresh fruits and vegetables. Limit foods that are high in fat and processed sugars, such as fried or sweet foods. Manage stress with relaxation techniques such as yoga or meditation. General instructions If you have problems with leakage: Use absorbable pads or wear padded underwear. Wash your genital and anal area frequently with mild soap. Keep your genital and anal area as clean and dry as possible. Ask your health care provider if you should try a barrier cream to prevent skin irritation. Take warm baths to relieve pelvic muscle tension or spasms. Take over-the-counter and prescription medicines only as told by your health care provider. Keep all follow-up visits. How is this prevented? The cause of PFD is not  always known, but there are a few things you can do to reduce the risk of developing this condition, including: Staying at a healthy weight. Getting regular exercise. Managing stress. Contact a health care provider if: Your symptoms are not improving with home care. You have signs or symptoms of PFD that get worse. You develop new signs or symptoms. You have signs of a urinary tract infection, such as: Fever. Chills. Increased urinary frequency. A burning feeling when urinating. You have not had a bowel movement in 3 days (constipation). Summary Pelvic floor dysfunction results when the muscles and connective tissues in your pelvic floor do not work well. These muscles and their connections form a sling that supports your colon and bladder. In men, these muscles also support the prostate gland. PFD may be caused by an injury to the pelvic area or by a weakening of pelvic muscles. PFD causes pelvic floor muscles to be too weak, too tight,  or a combination of both. Symptoms may vary from person to person. In most cases, PFD can be treated with physical therapies and medicines. Surgery may be an option if other treatments do not help. This information is not intended to replace advice given to you by your health care provider. Make sure you discuss any questions you have with your health care provider. Document Revised: 02/21/2021 Document Reviewed: 02/21/2021 Elsevier Patient Education  2024 [employer].

## 2023-12-24 NOTE — Progress Notes (Signed)
   12/24/2023 4:33 PM   Dhairya P Zinda May 27, 2006 [id number]  Reason for visit: Follow up pelvic floor dysfunction  HPI: 18 year old male who I originally saw on 08/06/2023 for severe symptoms of urinary frequency, nocturia, low back pain, constipation, lower abdominal pain, bilateral scrotal and testicular pain. Evaluation in the ER with urinalysis, STD testing and scrotal ultrasound were normal. His symptoms started after being kicked by dog.   He was treated for pelvic floor dysfunction with a 2-week course of Celebrex and pelvic floor stretching exercises.  When I saw him back in follow-up in October 2024 he reported his symptoms to completely resolve.  He reports recurrence of his symptoms with a multitude of complaints today including pelvic pain, testicle shrinkage, low back pain, pelvic pain, intermittent testicular pain, less sensitivity of the scrotal skin, urinary frequency, nocturia 1 time overnight.  He was seen in the ER where urinalysis and STD testing were negative, and a scrotal ultrasound was again benign.  He has been working with pelvic floor physical therapy in [place].  I offered cystoscopy to rule out urethral stricture but he deferred.  Very low suspicion for urethral stricture based on his symptoms and multiple normal urine samples, and primary symptoms of pain as opposed to urination issues.  We discussed the complexities of pelvic pain and possible range of etiologies including pelvic floor dysfunction, chronic bladder pain syndrome, and interstitial cystitis.  We reviewed the AUA guidelines that recommend an algorithmic approach to treatment for these patients, and that a trial of different medications and strategies is sometimes needed to find the approach that works best for each patient's unique situation.  I reinforced the importance of stress management, relaxation, avoiding triggers, and pain management in the approach to pelvic pain.  -Continue pelvic floor  physical therapy -Repeat course of 2 weeks of Celebrex sent in -Extensive information provided regarding avoiding bladder irritants -Could consider trial of low-dose amitriptyline in the future, but caution combining with his current Lexapro   Sondra Come, MD  [facility] [address] [place], [place] [address] [phone]

## 2024-01-08 ENCOUNTER — Other Ambulatory Visit: Payer: Self-pay

## 2024-01-15 ENCOUNTER — Emergency Department

## 2024-01-15 ENCOUNTER — Encounter: Payer: Self-pay | Admitting: *Deleted

## 2024-01-15 ENCOUNTER — Emergency Department
Admission: EM | Admit: 2024-01-15 | Discharge: 2024-01-15 | Disposition: A | Discharge status: Home / Self Care | Attending: Emergency Medicine | Admitting: Emergency Medicine

## 2024-01-15 ENCOUNTER — Other Ambulatory Visit: Payer: Self-pay

## 2024-01-15 DIAGNOSIS — R0602 Shortness of breath: Secondary | ICD-10-CM | POA: Diagnosis not present

## 2024-01-15 DIAGNOSIS — R079 Chest pain, unspecified: Secondary | ICD-10-CM | POA: Diagnosis not present

## 2024-01-15 DIAGNOSIS — R0789 Other chest pain: Secondary | ICD-10-CM | POA: Insufficient documentation

## 2024-01-15 LAB — BASIC METABOLIC PANEL
Anion gap: 10 (ref 5–15)
BUN: 13 mg/dL (ref 6–20)
CO2: 25 mmol/L (ref 22–32)
Calcium: 9.8 mg/dL (ref 8.9–10.3)
Chloride: 100 mmol/L (ref 98–111)
Creatinine, Ser: 0.87 mg/dL (ref 0.61–1.24)
GFR, Estimated: >60 mL/min (ref >60)
Glucose, Bld: 93 mg/dL (ref 70–99)
Potassium: 3.9 mmol/L (ref 3.5–5.1)
Sodium: 135 mmol/L (ref 135–145)

## 2024-01-15 LAB — CBC
HCT: 44.3 % (ref 39.0–52.0)
Hemoglobin: 15.8 g/dL (ref 13.0–17.0)
MCH: 31.2 pg (ref 26.0–34.0)
MCHC: 35.7 g/dL (ref 30.0–36.0)
MCV: 87.5 fL (ref 80.0–100.0)
Platelets: 247 10*3/uL (ref 150–400)
RBC: 5.06 MIL/uL (ref 4.22–5.81)
RDW: 12.6 % (ref 11.5–15.5)
WBC: 6.5 10*3/uL (ref 4.0–10.5)
nRBC: 0 % (ref 0.0–0.2)

## 2024-01-15 LAB — TROPONIN I (HIGH SENSITIVITY)
Troponin I (High Sensitivity): <2 ng/L (ref <18)
Troponin I (High Sensitivity): <2 ng/L (ref <18)

## 2024-01-15 NOTE — ED Provider Triage Note (Signed)
 Emergency Medicine Provider Triage Evaluation Note  David Boyle , a 18 y.o. male  was evaluated in triage.  Pt complains of left forearm tingling, shortness of breath, back "tightness" since yesterday. No chest pain.  Physical Exam  BP 106/61 (BP Location: Left Arm)   Pulse 88   Temp 98.9 F (37.2 C) (Oral)   Resp 18   Ht 6\' 3"  (1.905 m)   Wt 70.8 kg   SpO2 99%   BMI 19.50 kg/m  Gen:   Awake, no distress   Resp:  Normal effort  MSK:   Moves extremities without difficulty  Other:    Medical Decision Making  Medically screening exam initiated at 3:39 PM.  Appropriate orders placed.  David Boyle was informed that the remainder of the evaluation will be completed by another provider, this initial triage assessment does not replace that evaluation, and the importance of remaining in the ED until their evaluation is complete.  Protocols started.    Chinita Pester, FNP 01/15/24 1541

## 2024-01-15 NOTE — ED Provider Notes (Signed)
 [facility] Provider Note    Event Date/Time   First MD Initiated Contact with Patient 01/15/24 2118     (approximate)   History   Chief Complaint Chest Pain   HPI  David Boyle is a 18 y.o. male with past medical history of anxiety and migraines who presents to the ED complaining of chest pain.  Patient reports that he has been dealing with intermittent tightness and discomfort in the left side of his chest as well as his upper back for the past 3 days.  Patient states that he first seem to notice the pain when he was playing Xbox, states that he is typically in a hunched over position when he does so.  He has felt short of breath at times but denies any difficulty breathing currently.  He also denies any discomfort in his chest currently.  He has not had any fevers or cough and denies any pain or swelling in his legs.     Physical Exam   Triage Vital Signs: ED Triage Vitals  Encounter Vitals Group     BP 01/15/24 1536 106/61     Systolic BP Percentile --      Diastolic BP Percentile --      Pulse Rate 01/15/24 1536 88     Resp 01/15/24 1536 18     Temp 01/15/24 1536 98.9 F (37.2 C)     Temp Source 01/15/24 1536 Oral     SpO2 01/15/24 1536 99 %     Weight 01/15/24 1537 156 lb (70.8 kg)     Height 01/15/24 1537 6\' 3"  (1.905 m)     Head Circumference --      Peak Flow --      Pain Score 01/15/24 1537 3     Pain Loc --      Pain Education --      Exclude from Growth Chart --     Most recent vital signs: Vitals:   01/15/24 1536 01/15/24 2132  BP: 106/61 110/66  Pulse: 88 78  Resp: 18 16  Temp: 98.9 F (37.2 C) 98.7 F (37.1 C)  SpO2: 99% 100%    Constitutional: Alert and oriented. Eyes: Conjunctivae are normal. Head: Atraumatic. Nose: No congestion/rhinnorhea. Mouth/Throat: Mucous membranes are moist.  Cardiovascular: Normal rate, regular rhythm. Grossly normal heart sounds.  2+ radial pulses bilaterally. Respiratory: Normal  respiratory effort.  No retractions. Lungs CTAB.  Left chest wall tenderness to palpation. Gastrointestinal: Soft and nontender. No distention. Musculoskeletal: No lower extremity tenderness nor edema.  Neurologic:  Normal speech and language. No gross focal neurologic deficits are appreciated.    ED Results / Procedures / Treatments   Labs (all labs ordered are listed, but only abnormal results are displayed) Labs Reviewed  BASIC METABOLIC PANEL  CBC  TROPONIN I (HIGH SENSITIVITY)  TROPONIN I (HIGH SENSITIVITY)     EKG  ED ECG REPORT I, Chesley Noon, the attending physician, personally viewed and interpreted this ECG.   Date: 01/15/2024  EKG Time: 15:36  Rate: 84  Rhythm: normal sinus rhythm  Axis: RAD  Intervals:none  ST&T Change: None  RADIOLOGY Chest x-ray reviewed and interpreted by me with no infiltrate, edema, or effusion.  PROCEDURES:  Critical Care performed: No  Procedures   MEDICATIONS ORDERED IN ED: Medications - No data to display   IMPRESSION / MDM / ASSESSMENT AND PLAN / ED COURSE  I reviewed the triage vital signs and the nursing notes.  18 y.o. male with past medical history of anxiety and migraines who presents to the ED complaining of intermittent chest tightness with some mild difficulty breathing over the past 3 days.  Patient's presentation is most consistent with acute presentation with potential threat to life or bodily function.  Differential diagnosis includes, but is not limited to, ACS, PE, pneumonia, pneumothorax, musculoskeletal pain, GERD, anxiety.  Patient nontoxic-appearing and in no acute distress, vital signs are unremarkable.  EKG shows sinus arrhythmia with no evidence of ischemia, 2 sets of troponin are within normal limits.  I doubt ACS given atypical symptoms, suspect musculoskeletal etiology given pain reproducible with palpation.  He also seems to spend a significant amount of time in 1  position playing video games, which could contribute to musculoskeletal discomfort.  Chest x-ray is unremarkable and remainder of labs are reassuring with no significant anemia, leukocytosis, electrolyte abnormality, or AKI.  I doubt PE as patient is PERC negative.  He is appropriate for discharge home with outpatient follow-up, was counseled to return to the ED for new or worsening symptoms.  Patient and mother agree with plan.      FINAL CLINICAL IMPRESSION(S) / ED DIAGNOSES   Final diagnoses:  Nonspecific chest pain     Rx / DC Orders   ED Discharge Orders     None        Note:  This document was prepared using Dragon voice recognition software and may include unintentional dictation errors.   Chesley Noon, MD 01/15/24 2228

## 2024-01-15 NOTE — ED Triage Notes (Signed)
 Pt to triage via wheelchair.  Pt reports chest pain, and pain in upper back.  Pt also reports sob.    Pt denies cough.  Pt reports pain and tingling in left arm.  Sx began while playing xbox.   Pt alert  speech clear    non smoker

## 2024-02-18 DIAGNOSIS — F411 Generalized anxiety disorder: Secondary | ICD-10-CM | POA: Diagnosis not present

## 2024-02-18 DIAGNOSIS — R079 Chest pain, unspecified: Secondary | ICD-10-CM | POA: Diagnosis not present

## 2024-02-18 DIAGNOSIS — G43909 Migraine, unspecified, not intractable, without status migrainosus: Secondary | ICD-10-CM | POA: Diagnosis not present

## 2024-02-18 DIAGNOSIS — M2550 Pain in unspecified joint: Secondary | ICD-10-CM | POA: Diagnosis not present

## 2024-03-11 DIAGNOSIS — R202 Paresthesia of skin: Secondary | ICD-10-CM | POA: Diagnosis not present

## 2024-03-11 DIAGNOSIS — M2550 Pain in unspecified joint: Secondary | ICD-10-CM | POA: Diagnosis not present

## 2024-03-11 DIAGNOSIS — R21 Rash and other nonspecific skin eruption: Secondary | ICD-10-CM | POA: Diagnosis not present

## 2024-03-13 DIAGNOSIS — Z03818 Encounter for observation for suspected exposure to other biological agents ruled out: Secondary | ICD-10-CM | POA: Diagnosis not present

## 2024-03-13 DIAGNOSIS — J029 Acute pharyngitis, unspecified: Secondary | ICD-10-CM | POA: Diagnosis not present

## 2024-03-13 DIAGNOSIS — J039 Acute tonsillitis, unspecified: Secondary | ICD-10-CM | POA: Diagnosis not present

## 2024-04-20 DIAGNOSIS — F411 Generalized anxiety disorder: Secondary | ICD-10-CM | POA: Diagnosis not present

## 2024-04-20 DIAGNOSIS — F331 Major depressive disorder, recurrent, moderate: Secondary | ICD-10-CM | POA: Diagnosis not present

## 2024-05-11 DIAGNOSIS — F411 Generalized anxiety disorder: Secondary | ICD-10-CM | POA: Diagnosis not present

## 2024-05-11 DIAGNOSIS — M2550 Pain in unspecified joint: Secondary | ICD-10-CM | POA: Diagnosis not present

## 2024-06-29 DIAGNOSIS — E559 Vitamin D deficiency, unspecified: Secondary | ICD-10-CM | POA: Diagnosis not present

## 2024-06-29 DIAGNOSIS — F411 Generalized anxiety disorder: Secondary | ICD-10-CM | POA: Diagnosis not present

## 2024-06-29 DIAGNOSIS — E538 Deficiency of other specified B group vitamins: Secondary | ICD-10-CM | POA: Diagnosis not present

## 2024-06-29 DIAGNOSIS — M546 Pain in thoracic spine: Secondary | ICD-10-CM | POA: Diagnosis not present

## 2024-06-29 DIAGNOSIS — M2550 Pain in unspecified joint: Secondary | ICD-10-CM | POA: Diagnosis not present

## 2024-07-05 ENCOUNTER — Other Ambulatory Visit: Payer: Self-pay | Admitting: Infectious Diseases

## 2024-07-05 DIAGNOSIS — M546 Pain in thoracic spine: Secondary | ICD-10-CM

## 2024-07-05 DIAGNOSIS — R937 Abnormal findings on diagnostic imaging of other parts of musculoskeletal system: Secondary | ICD-10-CM

## 2024-07-11 DIAGNOSIS — Z03818 Encounter for observation for suspected exposure to other biological agents ruled out: Secondary | ICD-10-CM | POA: Diagnosis not present

## 2024-07-11 DIAGNOSIS — J020 Streptococcal pharyngitis: Secondary | ICD-10-CM | POA: Diagnosis not present

## 2024-07-11 DIAGNOSIS — J029 Acute pharyngitis, unspecified: Secondary | ICD-10-CM | POA: Diagnosis not present

## 2024-07-15 ENCOUNTER — Ambulatory Visit
Admission: RE | Admit: 2024-07-15 | Discharge: 2024-07-15 | Disposition: A | Discharge status: Home / Self Care | Source: Ambulatory Visit | Attending: Infectious Diseases | Admitting: Infectious Diseases

## 2024-07-15 DIAGNOSIS — M546 Pain in thoracic spine: Secondary | ICD-10-CM | POA: Insufficient documentation

## 2024-07-15 DIAGNOSIS — R937 Abnormal findings on diagnostic imaging of other parts of musculoskeletal system: Secondary | ICD-10-CM | POA: Diagnosis not present

## 2024-07-15 DIAGNOSIS — R071 Chest pain on breathing: Secondary | ICD-10-CM | POA: Diagnosis not present

## 2024-07-15 DIAGNOSIS — M25519 Pain in unspecified shoulder: Secondary | ICD-10-CM | POA: Diagnosis not present

## 2024-07-28 DIAGNOSIS — J029 Acute pharyngitis, unspecified: Secondary | ICD-10-CM | POA: Diagnosis not present

## 2024-07-28 DIAGNOSIS — E538 Deficiency of other specified B group vitamins: Secondary | ICD-10-CM | POA: Diagnosis not present

## 2024-07-28 DIAGNOSIS — M2550 Pain in unspecified joint: Secondary | ICD-10-CM | POA: Diagnosis not present

## 2024-07-28 DIAGNOSIS — M546 Pain in thoracic spine: Secondary | ICD-10-CM | POA: Diagnosis not present

## 2024-07-28 DIAGNOSIS — F411 Generalized anxiety disorder: Secondary | ICD-10-CM | POA: Diagnosis not present

## 2024-07-28 DIAGNOSIS — E559 Vitamin D deficiency, unspecified: Secondary | ICD-10-CM | POA: Diagnosis not present

## 2024-07-30 DIAGNOSIS — F411 Generalized anxiety disorder: Secondary | ICD-10-CM | POA: Diagnosis not present

## 2024-07-30 DIAGNOSIS — R4589 Other symptoms and signs involving emotional state: Secondary | ICD-10-CM | POA: Diagnosis not present

## 2024-07-30 DIAGNOSIS — Z1331 Encounter for screening for depression: Secondary | ICD-10-CM | POA: Diagnosis not present

## 2024-07-30 DIAGNOSIS — M2550 Pain in unspecified joint: Secondary | ICD-10-CM | POA: Diagnosis not present

## 2024-07-30 DIAGNOSIS — Z23 Encounter for immunization: Secondary | ICD-10-CM | POA: Diagnosis not present

## 2024-08-12 DIAGNOSIS — J359 Chronic disease of tonsils and adenoids, unspecified: Secondary | ICD-10-CM | POA: Diagnosis not present

## 2024-08-12 DIAGNOSIS — J029 Acute pharyngitis, unspecified: Secondary | ICD-10-CM | POA: Diagnosis not present

## 2024-08-12 DIAGNOSIS — Z03818 Encounter for observation for suspected exposure to other biological agents ruled out: Secondary | ICD-10-CM | POA: Diagnosis not present

## 2024-08-12 DIAGNOSIS — J039 Acute tonsillitis, unspecified: Secondary | ICD-10-CM | POA: Diagnosis not present

## 2024-08-24 DIAGNOSIS — D3709 Neoplasm of uncertain behavior of other specified sites of the oral cavity: Secondary | ICD-10-CM | POA: Diagnosis not present

## 2024-08-24 DIAGNOSIS — J309 Allergic rhinitis, unspecified: Secondary | ICD-10-CM | POA: Diagnosis not present

## 2024-08-31 DIAGNOSIS — K219 Gastro-esophageal reflux disease without esophagitis: Secondary | ICD-10-CM | POA: Diagnosis not present

## 2024-08-31 DIAGNOSIS — S0990XD Unspecified injury of head, subsequent encounter: Secondary | ICD-10-CM | POA: Diagnosis not present

## 2024-10-11 DIAGNOSIS — K219 Gastro-esophageal reflux disease without esophagitis: Secondary | ICD-10-CM | POA: Diagnosis not present

## 2024-10-11 DIAGNOSIS — J3501 Chronic tonsillitis: Secondary | ICD-10-CM | POA: Diagnosis not present

## 2024-10-12 DIAGNOSIS — G43019 Migraine without aura, intractable, without status migrainosus: Secondary | ICD-10-CM | POA: Diagnosis not present

## 2024-10-12 DIAGNOSIS — E569 Vitamin deficiency, unspecified: Secondary | ICD-10-CM | POA: Diagnosis not present

## 2024-10-12 DIAGNOSIS — F411 Generalized anxiety disorder: Secondary | ICD-10-CM | POA: Diagnosis not present

## 2024-10-12 DIAGNOSIS — F0781 Postconcussional syndrome: Secondary | ICD-10-CM | POA: Diagnosis not present
